# Patient Record
Sex: Female | Born: 1980 | Race: Black or African American | Hispanic: No | Marital: Married | Smoking: Never smoker
Health system: Southern US, Community
[De-identification: ages and names within clinical notes are randomized; demographics above are authoritative.]

## PROBLEM LIST (undated history)

## (undated) ENCOUNTER — Inpatient Hospital Stay (HOSPITAL_COMMUNITY): Payer: Self-pay

## (undated) DIAGNOSIS — B009 Herpesviral infection, unspecified: Secondary | ICD-10-CM

## (undated) HISTORY — PX: HERNIA REPAIR: SHX51

---
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
## 2012-09-15 NOTE — L&D Delivery Note (Signed)
Pt slowly progressed through the first stage. Just prior to pushing she had a prolonged decel which was txd with position changes, terb, and O2. The reactivity remained good throughout. She pushed for 40 minutes. She had a SVD of one live viable black female infant over a second degree midline tear in the ROA position. Placenta S/I . EBL-400cc. Baby to NBN. Tear closed with 3-0 chromic.

## 2012-12-15 LAB — OB RESULTS CONSOLE GC/CHLAMYDIA
Chlamydia: NEGATIVE
Gonorrhea: NEGATIVE

## 2012-12-15 LAB — OB RESULTS CONSOLE HEPATITIS B SURFACE ANTIGEN: Hepatitis B Surface Ag: NEGATIVE

## 2012-12-15 LAB — OB RESULTS CONSOLE ANTIBODY SCREEN: Antibody Screen: NEGATIVE

## 2012-12-20 LAB — OB RESULTS CONSOLE GBS: GBS: NEGATIVE

## 2013-01-12 ENCOUNTER — Encounter (HOSPITAL_COMMUNITY): Payer: Self-pay

## 2013-01-12 ENCOUNTER — Inpatient Hospital Stay (HOSPITAL_COMMUNITY)
Admission: AD | Admit: 2013-01-12 | Discharge: 2013-01-14 | DRG: 373 | Disposition: A | Discharge status: Home / Self Care | Payer: BC Managed Care – PPO | Source: Ambulatory Visit | Attending: Obstetrics and Gynecology | Admitting: Obstetrics and Gynecology

## 2013-01-12 LAB — AMNISURE RUPTURE OF MEMBRANE (ROM) NOT AT ARMC: Amnisure ROM: NEGATIVE

## 2013-01-12 LAB — CBC
HCT: 37.5 % (ref 36.0–46.0)
Hemoglobin: 12.6 g/dL (ref 12.0–15.0)
RBC: 3.97 MIL/uL (ref 3.87–5.11)
WBC: 13.7 10*3/uL — ABNORMAL HIGH (ref 4.0–10.5)

## 2013-01-12 MED ORDER — LIDOCAINE HCL (PF) 1 % IJ SOLN
30.0000 mL | INTRAMUSCULAR | Status: DC | PRN
  Filled 2013-01-12: qty 30

## 2013-01-12 MED ORDER — OXYTOCIN BOLUS FROM INFUSION
500.0000 mL | INTRAVENOUS | Status: DC
  Administered 2013-01-13: 500 mL via INTRAVENOUS

## 2013-01-12 MED ORDER — LACTATED RINGERS IV SOLN
INTRAVENOUS | Status: DC
  Administered 2013-01-12 – 2013-01-13 (×3): 125 mL/h via INTRAVENOUS

## 2013-01-12 MED ORDER — ONDANSETRON HCL 4 MG/2ML IJ SOLN
4.0000 mg | Freq: Four times a day (QID) | INTRAMUSCULAR | Status: DC | PRN
  Administered 2013-01-13: 4 mg via INTRAVENOUS
  Filled 2013-01-12: qty 2

## 2013-01-12 MED ORDER — OXYCODONE-ACETAMINOPHEN 5-325 MG PO TABS: 1.0000 | ORAL_TABLET | ORAL | Status: DC | PRN

## 2013-01-12 MED ORDER — ACETAMINOPHEN 325 MG PO TABS: 650.0000 mg | ORAL_TABLET | ORAL | Status: DC | PRN

## 2013-01-12 MED ORDER — TERBUTALINE SULFATE 1 MG/ML IJ SOLN: 0.2500 mg | Freq: Once | INTRAMUSCULAR | Status: AC | PRN

## 2013-01-12 MED ORDER — CITRIC ACID-SODIUM CITRATE 334-500 MG/5ML PO SOLN: 30.0000 mL | ORAL | Status: DC | PRN

## 2013-01-12 MED ORDER — BUTORPHANOL TARTRATE 1 MG/ML IJ SOLN
1.0000 mg | INTRAMUSCULAR | Status: DC | PRN
  Administered 2013-01-13 (×2): 1 mg via INTRAVENOUS
  Filled 2013-01-12 (×2): qty 1

## 2013-01-12 MED ORDER — LACTATED RINGERS IV SOLN
500.0000 mL | INTRAVENOUS | Status: DC | PRN
  Administered 2013-01-13: 500 mL via INTRAVENOUS
  Administered 2013-01-13: 1000 mL via INTRAVENOUS

## 2013-01-12 MED ORDER — OXYTOCIN 40 UNITS IN LACTATED RINGERS INFUSION - SIMPLE MED
62.5000 mL/h | INTRAVENOUS | Status: DC
  Administered 2013-01-13: 62.5 mL/h via INTRAVENOUS
  Filled 2013-01-12: qty 1000

## 2013-01-12 MED ORDER — OXYTOCIN 40 UNITS IN LACTATED RINGERS INFUSION - SIMPLE MED
1.0000 m[IU]/min | INTRAVENOUS | Status: DC
  Administered 2013-01-13: 2 m[IU]/min via INTRAVENOUS
  Administered 2013-01-13: 8 m[IU]/min via INTRAVENOUS
  Administered 2013-01-13: 2 m[IU]/min via INTRAVENOUS

## 2013-01-12 MED ORDER — IBUPROFEN 600 MG PO TABS: 600.0000 mg | ORAL_TABLET | Freq: Four times a day (QID) | ORAL | Status: DC | PRN

## 2013-01-12 MED ORDER — FLEET ENEMA 7-19 GM/118ML RE ENEM: 1.0000 | ENEMA | RECTAL | Status: DC | PRN

## 2013-01-12 NOTE — MAU Note (Signed)
Patient states she started leaking clear/yellowish fluid at 1615. Reports good fetal movement. Having some low back pain. Patient is scheduled for IOL tomorrow.

## 2013-01-12 NOTE — MAU Note (Signed)
Report given to [place], CN in BS. Patient will be evaluated in room 163.

## 2013-01-12 NOTE — Progress Notes (Signed)
MD on the phone and notified RN visualized leaking and fern positive. MD states to have midlevel preform speculum exam to confirm rupture.

## 2013-01-12 NOTE — MAU Provider Note (Signed)
Ms. Kelly Parrish is a 32 y.o. G2P1 at [redacted]w[redacted]d who presents to [facility] with possible ROM.   RN called from birthing suites. Dr. Dareen Piano would like speculum exam for confirmation of pooling for ROM.   + Pooling on speculum exam RN reports + Fer[place]  Report given to RN to contact Dr. Dareen Piano for further orders.   Freddi Starr, PA-C 01/12/2013 8:25 PM

## 2013-01-13 ENCOUNTER — Encounter (HOSPITAL_COMMUNITY): Payer: Self-pay | Admitting: Anesthesiology

## 2013-01-13 ENCOUNTER — Inpatient Hospital Stay (HOSPITAL_COMMUNITY): Payer: BC Managed Care – PPO | Admitting: Anesthesiology

## 2013-01-13 MED ORDER — OXYCODONE-ACETAMINOPHEN 5-325 MG PO TABS: 1.0000 | ORAL_TABLET | ORAL | Status: DC | PRN

## 2013-01-13 MED ORDER — TERBUTALINE SULFATE 1 MG/ML IJ SOLN
INTRAMUSCULAR | Status: AC
  Administered 2013-01-13: 0.25 mg via SUBCUTANEOUS
  Filled 2013-01-13: qty 1

## 2013-01-13 MED ORDER — LACTATED RINGERS IV SOLN: 500.0000 mL | Freq: Once | INTRAVENOUS | Status: DC

## 2013-01-13 MED ORDER — LIDOCAINE HCL (PF) 1 % IJ SOLN
INTRAMUSCULAR | Status: DC | PRN
Start: 2013-01-13 — End: 2013-01-13
  Administered 2013-01-13 (×2): 5 mL

## 2013-01-13 MED ORDER — EPHEDRINE 5 MG/ML INJ
10.0000 mg | INTRAVENOUS | Status: DC | PRN
  Filled 2013-01-13: qty 4
  Filled 2013-01-13: qty 2

## 2013-01-13 MED ORDER — ONDANSETRON HCL 4 MG PO TABS: 4.0000 mg | ORAL_TABLET | ORAL | Status: DC | PRN

## 2013-01-13 MED ORDER — EPHEDRINE 5 MG/ML INJ
10.0000 mg | INTRAVENOUS | Status: DC | PRN
  Filled 2013-01-13: qty 2

## 2013-01-13 MED ORDER — PRENATAL MULTIVITAMIN CH
1.0000 | ORAL_TABLET | Freq: Every day | ORAL | Status: DC
  Administered 2013-01-14: 1 via ORAL
  Filled 2013-01-13: qty 1

## 2013-01-13 MED ORDER — MEASLES, MUMPS & RUBELLA VAC ~~LOC~~ INJ
0.5000 mL | INJECTION | Freq: Once | SUBCUTANEOUS | Status: DC
  Filled 2013-01-13: qty 0.5

## 2013-01-13 MED ORDER — PHENYLEPHRINE 40 MCG/ML (10ML) SYRINGE FOR IV PUSH (FOR BLOOD PRESSURE SUPPORT)
80.0000 ug | PREFILLED_SYRINGE | INTRAVENOUS | Status: DC | PRN
  Filled 2013-01-13: qty 5
  Filled 2013-01-13: qty 2

## 2013-01-13 MED ORDER — BENZOCAINE-MENTHOL 20-0.5 % EX AERO
1.0000 "application " | INHALATION_SPRAY | CUTANEOUS | Status: DC | PRN
  Administered 2013-01-13: 1 via TOPICAL
  Filled 2013-01-13: qty 56

## 2013-01-13 MED ORDER — TETANUS-DIPHTH-ACELL PERTUSSIS 5-2.5-18.5 LF-MCG/0.5 IM SUSP
0.5000 mL | Freq: Once | INTRAMUSCULAR | Status: AC
  Administered 2013-01-14: 0.5 mL via INTRAMUSCULAR
  Filled 2013-01-13: qty 0.5

## 2013-01-13 MED ORDER — DIPHENHYDRAMINE HCL 50 MG/ML IJ SOLN: 12.5000 mg | INTRAMUSCULAR | Status: DC | PRN

## 2013-01-13 MED ORDER — FENTANYL 2.5 MCG/ML BUPIVACAINE 1/10 % EPIDURAL INFUSION (WH - ANES)
14.0000 mL/h | INTRAMUSCULAR | Status: DC | PRN
  Administered 2013-01-13: 14 mL/h via EPIDURAL
  Filled 2013-01-13 (×3): qty 125

## 2013-01-13 MED ORDER — FENTANYL 2.5 MCG/ML BUPIVACAINE 1/10 % EPIDURAL INFUSION (WH - ANES)
INTRAMUSCULAR | Status: DC | PRN
  Administered 2013-01-13: 16 mL/h via EPIDURAL

## 2013-01-13 MED ORDER — PHENYLEPHRINE 40 MCG/ML (10ML) SYRINGE FOR IV PUSH (FOR BLOOD PRESSURE SUPPORT)
80.0000 ug | PREFILLED_SYRINGE | INTRAVENOUS | Status: DC | PRN
  Filled 2013-01-13: qty 2

## 2013-01-13 MED ORDER — SIMETHICONE 80 MG PO CHEW: 80.0000 mg | CHEWABLE_TABLET | ORAL | Status: DC | PRN

## 2013-01-13 MED ORDER — ONDANSETRON HCL 4 MG/2ML IJ SOLN: 4.0000 mg | INTRAMUSCULAR | Status: DC | PRN

## 2013-01-13 MED ORDER — DIBUCAINE 1 % RE OINT
1.0000 "application " | TOPICAL_OINTMENT | RECTAL | Status: DC | PRN
  Administered 2013-01-13: 1 via RECTAL
  Filled 2013-01-13: qty 28

## 2013-01-13 MED ORDER — WITCH HAZEL-GLYCERIN EX PADS
1.0000 "application " | MEDICATED_PAD | CUTANEOUS | Status: DC | PRN
  Administered 2013-01-13: 1 via TOPICAL

## 2013-01-13 MED ORDER — IBUPROFEN 600 MG PO TABS
600.0000 mg | ORAL_TABLET | Freq: Four times a day (QID) | ORAL | Status: DC
  Administered 2013-01-13 – 2013-01-14 (×5): 600 mg via ORAL
  Filled 2013-01-13 (×5): qty 1

## 2013-01-13 MED ORDER — ZOLPIDEM TARTRATE 5 MG PO TABS: 5.0000 mg | ORAL_TABLET | Freq: Every evening | ORAL | Status: DC | PRN

## 2013-01-13 NOTE — Anesthesia Procedure Notes (Signed)
Epidural Patient location during procedure: OB Start time: 01/13/2013 3:50 AM  Staffing Anesthesiologist: Jadavion Spoelstra A. Performed by: anesthesiologist   Preanesthetic Checklist Completed: patient identified, site marked, surgical consent, pre-op evaluation, timeout performed, IV checked, risks and benefits discussed and monitors and equipment checked  Epidural Patient position: sitting Prep: site prepped and draped and DuraPrep Patient monitoring: continuous pulse ox and blood pressure Approach: midline Injection technique: LOR air  Needle:  Needle type: Tuohy  Needle gauge: 17 G Needle length: 9 cm and 9 Needle insertion depth: 5 cm cm Catheter type: closed end flexible Catheter size: 19 Gauge Catheter at skin depth: 10 cm Test dose: negative and Other  Assessment Events: blood not aspirated, injection not painful, no injection resistance, negative IV test and no paresthesia  Additional Notes Patient identified. Risks and benefits discussed including failed block, incomplete  Pain control, post dural puncture headache, nerve damage, paralysis, blood pressure Changes, nausea, vomiting, reactions to medications-both toxic and allergic and post Partum back pain. All questions were answered. Patient expressed understanding and wished to proceed. Sterile technique was used throughout procedure. Epidural site was Dressed with sterile barrier dressing. No paresthesias, signs of intravascular injection Or signs of intrathecal spread were encountered.  Patient was more comfortable after the epidural was dosed. Please see RN's note for documentation of vital signs and FHR which are stable.

## 2013-01-13 NOTE — Progress Notes (Signed)
Dr Dareen Piano notified of fhr changes, interventions and Pitocin stopped.  No new orders

## 2013-01-13 NOTE — Progress Notes (Signed)
Dr Dareen Piano notified of prolonged decel.  Pitocin off, Terb given, pt turned. IV bolus and oxygen

## 2013-01-13 NOTE — Anesthesia Preprocedure Evaluation (Signed)
Anesthesia Evaluation  Patient identified by MRN, date of birth, ID band Patient awake    Reviewed: Allergy & Precautions, H&P , Patient's Chart, lab work & pertinent test results  Airway Mallampati: III TM Distance: >3 FB Neck ROM: Full    Dental no notable dental hx. (+) Teeth Intact   Pulmonary neg pulmonary ROS,  breath sounds clear to auscultation  Pulmonary exam normal       Cardiovascular negative cardio ROS  Rhythm:Regular Rate:Normal     Neuro/Psych negative neurological ROS  negative psych ROS   GI/Hepatic Neg liver ROS, GERD-  Medicated and Controlled,  Endo/Other  Obesity   Renal/GU negative Renal ROS  negative genitourinary   Musculoskeletal negative musculoskeletal ROS (+)   Abdominal (+) + obese,   Peds  Hematology negative hematology ROS (+)   Anesthesia Other Findings   Reproductive/Obstetrics (+) Pregnancy                           Anesthesia Physical Anesthesia Plan  ASA: II  Anesthesia Plan: Epidural   Post-op Pain Management:    Induction:   Airway Management Planned: Natural Airway  Additional Equipment:   Intra-op Plan:   Post-operative Plan:   Informed Consent: I have reviewed the patients History and Physical, chart, labs and discussed the procedure including the risks, benefits and alternatives for the proposed anesthesia with the patient or authorized representative who has indicated his/her understanding and acceptance.     Plan Discussed with: Anesthesiologist  Anesthesia Plan Comments:         Anesthesia Quick Evaluation   

## 2013-01-14 LAB — CBC
MCH: 31.6 pg (ref 26.0–34.0)
MCV: 95.1 fL (ref 78.0–100.0)
Platelets: 145 10*3/uL — ABNORMAL LOW (ref 150–400)
RBC: 3.04 MIL/uL — ABNORMAL LOW (ref 3.87–5.11)
RDW: 14.7 % (ref 11.5–15.5)
WBC: 20.4 10*3/uL — ABNORMAL HIGH (ref 4.0–10.5)

## 2013-01-14 MED ORDER — IBUPROFEN 600 MG PO TABS: 600.0000 mg | ORAL_TABLET | Freq: Four times a day (QID) | ORAL | Status: DC | PRN

## 2013-01-14 MED ORDER — DOCUSATE SODIUM 100 MG PO CAPS: 100.0000 mg | ORAL_CAPSULE | Freq: Two times a day (BID) | ORAL | Status: DC

## 2013-01-14 MED ORDER — OXYCODONE-ACETAMINOPHEN 5-325 MG PO TABS: 2.0000 | ORAL_TABLET | ORAL | Status: DC | PRN

## 2013-01-14 NOTE — Anesthesia Postprocedure Evaluation (Signed)
  Anesthesia Post-op Note  Patient: Kelly Parrish  Procedure(s) Performed: * No procedures listed *  Patient Location: PACU and Mother/Baby  Anesthesia Type:Epidural  Level of Consciousness: awake, alert  and oriented  Airway and Oxygen Therapy: Patient Spontanous Breathing  Post-op Pain: none  Post-op Assessment: Post-op Vital signs reviewed, Patient's Cardiovascular Status Stable, No headache, No backache, No residual numbness and No residual motor weakness  Post-op Vital Signs: Reviewed and stable  Complications: No apparent anesthesia complications

## 2013-01-14 NOTE — Discharge Summary (Signed)
Obstetric Discharge Summary Reason for Admission: onset of labor Prenatal Procedures: NST and ultrasound Intrapartum Procedures: spontaneous vaginal delivery Postpartum Procedures: none Complications-Operative and Postpartum: 2nd degree perineal laceration Hemoglobin  Date Value Range Status  01/14/2013 9.6* 12.0 - 15.0 g/dL Final     DELTA CHECK NOTED     REPEATED TO VERIFY     HCT  Date Value Range Status  01/14/2013 28.9* 36.0 - 46.0 % Final    Physical Exam:  General: alert, cooperative and appears stated age 32: appropriate Uterine Fundus: firm Discharge Diagnoses: Term Pregnancy-delivered  Discharge Information: Date: 01/14/2013 Activity: pelvic rest Diet: routine Medications: Ibuprofen, Colace and Percocet Condition: improved Instructions: refer to practice specific booklet Discharge to: home Follow-up Information   Follow up with Levi Aland, MD In 4 weeks. (For a postpartum evaluation)    Contact information:   [address] [place] [address] [phone]       Newborn Data: Live born female  Birth Weight: 8 lb 15.6 oz (4071 g) APGAR: 8, 9  Home with mother.  Cyndal Kasson H. 01/14/2013, 9:57 AM

## 2013-01-14 NOTE — Progress Notes (Signed)
Post Partum Day 1 Subjective: no complaints, up ad lib, voiding and tolerating PO  Objective: Blood pressure 106/58, pulse 98, temperature 98.3 F (36.8 C), temperature source Axillary, resp. rate 18, height 5\' 10"  (1.778 m), weight 107.956 kg (238 lb), SpO2 100.00%, unknown if currently breastfeeding.  Physical Exam:  General: alert, cooperative and appears stated age Lochia: appropriate Uterine Fundus: firm   Recent Labs  01/12/13 2046 01/14/13 0605  HGB 12.6 9.6*  HCT 37.5 28.9*    Assessment/Plan: Plan for discharge tomorrow, Breastfeeding and Circumcision prior to discharge R/B/A of Circumcision reviewed with patient. Will perform circ once infant urinates   LOS: 2 days   Rual Vermeer H. 01/14/2013, 9:52 AM

## 2013-01-31 NOTE — H&P (Signed)
Pt is a 32 year old female, G2 P0010 at term who presents to L&D in labor. PNC was uncomplicated.  PMHX: see hollister PE: VSSAF         HEENT- wnl        ABD- gravid, palp contractions        FHTs- reactive IMP/ IUP at term in labor. PLAN/ Admit

## 2014-07-17 ENCOUNTER — Encounter (HOSPITAL_COMMUNITY): Payer: Self-pay | Admitting: Anesthesiology

## 2015-09-16 NOTE — L&D Delivery Note (Signed)
Patient was C/C/+3 and pushed for 1 minutes with epidural.    NSVD  female infant, Apgars 8,9, weight P.   The patient had Parrish R labial and first degree perineal lacerations repaired with 3-0 and 2-0 vicryl R. Fundus was firm. EBL was expected amount. Placenta was delivered intact. Vagina was clear.  Baby was vigorous and doing skin to skin with mother.  Kelly Parrish

## 2015-12-21 LAB — OB RESULTS CONSOLE GC/CHLAMYDIA
CHLAMYDIA, DNA PROBE: NEGATIVE
GC PROBE AMP, GENITAL: NEGATIVE

## 2015-12-21 LAB — OB RESULTS CONSOLE RPR: RPR: NONREACTIVE

## 2015-12-21 LAB — OB RESULTS CONSOLE ABO/RH: RH Type: POSITIVE

## 2015-12-21 LAB — OB RESULTS CONSOLE RUBELLA ANTIBODY, IGM: Rubella: IMMUNE

## 2015-12-21 LAB — OB RESULTS CONSOLE HEPATITIS B SURFACE ANTIGEN: HEP B S AG: NEGATIVE

## 2015-12-21 LAB — OB RESULTS CONSOLE HIV ANTIBODY (ROUTINE TESTING): HIV: NONREACTIVE

## 2015-12-21 LAB — OB RESULTS CONSOLE ANTIBODY SCREEN: ANTIBODY SCREEN: NEGATIVE

## 2016-01-01 ENCOUNTER — Inpatient Hospital Stay (HOSPITAL_COMMUNITY)
Admission: AD | Admit: 2016-01-01 | Discharge: 2016-01-01 | Disposition: A | Discharge status: Home / Self Care | Payer: BLUE CROSS/BLUE SHIELD | Source: Ambulatory Visit | Attending: Obstetrics and Gynecology | Admitting: Obstetrics and Gynecology

## 2016-01-01 ENCOUNTER — Encounter (HOSPITAL_COMMUNITY): Payer: Self-pay | Admitting: *Deleted

## 2016-01-01 DIAGNOSIS — O99611 Diseases of the digestive system complicating pregnancy, first trimester: Secondary | ICD-10-CM | POA: Diagnosis not present

## 2016-01-01 DIAGNOSIS — O219 Vomiting of pregnancy, unspecified: Secondary | ICD-10-CM | POA: Diagnosis not present

## 2016-01-01 DIAGNOSIS — K117 Disturbances of salivary secretion: Secondary | ICD-10-CM | POA: Diagnosis not present

## 2016-01-01 DIAGNOSIS — Z3A08 8 weeks gestation of pregnancy: Secondary | ICD-10-CM | POA: Insufficient documentation

## 2016-01-01 DIAGNOSIS — F5089 Other specified eating disorder: Secondary | ICD-10-CM | POA: Diagnosis not present

## 2016-01-01 DIAGNOSIS — O210 Mild hyperemesis gravidarum: Secondary | ICD-10-CM | POA: Insufficient documentation

## 2016-01-01 DIAGNOSIS — R112 Nausea with vomiting, unspecified: Secondary | ICD-10-CM | POA: Diagnosis present

## 2016-01-01 HISTORY — DX: Herpesviral infection, unspecified: B00.9

## 2016-01-01 LAB — URINALYSIS, ROUTINE W REFLEX MICROSCOPIC
Bilirubin Urine: NEGATIVE
GLUCOSE, UA: NEGATIVE mg/dL
KETONES UR: NEGATIVE mg/dL
Nitrite: NEGATIVE
PROTEIN: NEGATIVE mg/dL
Specific Gravity, Urine: 1.025 (ref 1.005–1.030)
pH: 6.5 (ref 5.0–8.0)

## 2016-01-01 LAB — URINE MICROSCOPIC-ADD ON: Bacteria, UA: NONE SEEN

## 2016-01-01 LAB — POCT PREGNANCY, URINE: PREG TEST UR: POSITIVE — AB

## 2016-01-01 MED ORDER — GLYCOPYRROLATE 1 MG PO TABS: 1.0000 mg | ORAL_TABLET | Freq: Three times a day (TID) | ORAL | Status: DC

## 2016-01-01 MED ORDER — SODIUM CHLORIDE 0.9 % IV SOLN: INTRAVENOUS | Status: DC

## 2016-01-01 MED ORDER — LACTATED RINGERS IV BOLUS (SEPSIS)
1000.0000 mL | Freq: Once | INTRAVENOUS | Status: AC
  Administered 2016-01-01: 1000 mL via INTRAVENOUS

## 2016-01-01 MED ORDER — PROMETHAZINE HCL 25 MG PO TABS: 12.5000 mg | ORAL_TABLET | Freq: Four times a day (QID) | ORAL | Status: DC | PRN

## 2016-01-01 MED ORDER — PROMETHAZINE HCL 25 MG/ML IJ SOLN
25.0000 mg | Freq: Once | INTRAMUSCULAR | Status: AC
  Administered 2016-01-01: 25 mg via INTRAVENOUS
  Filled 2016-01-01: qty 1

## 2016-01-01 NOTE — MAU Provider Note (Signed)
Chief Complaint: No chief complaint on file.   First Provider Initiated Contact with Patient 01/01/16 1820        SUBJECTIVE  HPI: Kelly Parrish is a 35 y.o. G3P2002 at [redacted]w[redacted]d by LMP who presents to maternity admissions reporting nausea and vomiting for 2 days.  States cannot keep any fluids down, vomits them up.  Can keep down potatoes and oatmeal.  Eating a lot of ice, so the family told her she was anemic, so she took "some iron pills".  Has not been told that by MDs.  Spitting a lot.  Wants med for that. She denies vaginal bleeding, vaginal itching/burning, urinary symptoms, h/a, dizziness, n/v, or fever/chills.    Emesis  This is a new problem. The current episode started in the past 7 days. The problem occurs less than 2 times per day. The problem has been unchanged. There has been no fever. Pertinent negatives include no abdominal pain, chills, diarrhea, dizziness, fever, headaches or myalgias. She has tried nothing for the symptoms.   RN Note: Has been weak for about a week. Started on iron pills yesterday.keeping. down a little bit of food, but not drinks. Hasn't really vomited the past 2 days. Has been seen at dr's office. Called them today and was instructed to come in         Past Medical History  Diagnosis Date  . Herpes    Past Surgical History  Procedure Laterality Date  . Wisdom tooth extraction  1996  . Hernia repair  1981   Social History   Social History  . Marital Status: Single    Spouse Name: N/A  . Number of Children: N/A  . Years of Education: N/A   Occupational History  . Not on file.   Social History Main Topics  . Smoking status: Never Smoker   . Smokeless tobacco: Never Used  . Alcohol Use: No  . Drug Use: No  . Sexual Activity: Yes    Birth Control/ Protection: None   Other Topics Concern  . Not on file   Social History Narrative   No current facility-administered medications on file prior to encounter.   Current Outpatient  Prescriptions on File Prior to Encounter  Medication Sig Dispense Refill  . docusate sodium (COLACE) 100 MG capsule Take 1 capsule (100 mg total) by mouth 2 (two) times daily. 60 capsule 0  . ferrous fumarate (HEMOCYTE - 106 MG FE) 325 (106 FE) MG TABS Take 1 tablet by mouth.    Marland Kitchen ibuprofen (ADVIL,MOTRIN) 600 MG tablet Take 1 tablet (600 mg total) by mouth every 6 (six) hours as needed for pain. 90 tablet 0  . oxyCODONE-acetaminophen (ROXICET) 5-325 MG per tablet Take 2 tablets by mouth every 4 (four) hours as needed for pain. May take 1-2 tablets every 4-6 hours as needed for pain 30 tablet 0  . Prenatal Vit-Fe Fumarate-FA (PRENATAL MULTIVITAMIN) TABS Take 1 tablet by mouth daily at 12 noon.     No Known Allergies  I have reviewed patient's Past Medical Hx, Surgical Hx, Family Hx, Social Hx, medications and allergies.   ROS:  Review of Systems  Constitutional: Negative for fever and chills.  Respiratory: Negative for shortness of breath.   Gastrointestinal: Positive for nausea and vomiting. Negative for abdominal pain, diarrhea and constipation.  Genitourinary: Negative for dysuria, vaginal bleeding, vaginal discharge and pelvic pain.  Musculoskeletal: Negative for myalgias.  Neurological: Negative for dizziness and headaches.    Physical Exam  Patient Vitals  for the past 24 hrs:  BP Temp Temp src Pulse Resp SpO2 Height Weight  01/01/16 1830 110/70 mmHg - - 77 - - - -  01/01/16 1828 113/70 mmHg - - 64 - - - -  01/01/16 1827 117/68 mmHg - - 63 - - - -  01/01/16 1613 - - - - - 100 % - -  01/01/16 1559 118/59 mmHg 98.5 F (36.9 C) Oral 68 18 - 5\' 10"  (1.778 m) 83.178 kg (183 lb 6 oz)    Physical Exam  Constitutional: Well-developed, female in no acute distress. Spitting frequently Cardiovascular: normal rate and rhythm.  No tachycardia Respiratory: normal effort, lungs clear bilaterally GI: Abd soft, non-tender. Pos BS x 4 MS: Extremities nontender, no edema, normal  ROM Neurologic: Alert and oriented x 4.  GU: Neg CVAT.   LAB RESULTS Results for orders placed or performed during the hospital encounter of 01/01/16 (from the past 24 hour(s))  Urinalysis, Routine w reflex microscopic (not at [facility])     Status: Abnormal   Collection Time: 01/01/16  4:19 PM  Result Value Ref Range   Color, Urine YELLOW YELLOW   APPearance CLEAR CLEAR   Specific Gravity, Urine 1.025 1.005 - 1.030   pH 6.5 5.0 - 8.0   Glucose, UA NEGATIVE NEGATIVE mg/dL   Hgb urine dipstick TRACE (A) NEGATIVE   Bilirubin Urine NEGATIVE NEGATIVE   Ketones, ur NEGATIVE NEGATIVE mg/dL   Protein, ur NEGATIVE NEGATIVE mg/dL   Nitrite NEGATIVE NEGATIVE   Leukocytes, UA TRACE (A) NEGATIVE  Urine microscopic-add on     Status: Abnormal   Collection Time: 01/01/16  4:19 PM  Result Value Ref Range   Squamous Epithelial / LPF 0-5 (A) NONE SEEN   WBC, UA 0-5 0 - 5 WBC/hpf   RBC / HPF 0-5 0 - 5 RBC/hpf   Bacteria, UA NONE SEEN NONE SEEN   Urine-Other MUCOUS PRESENT   Pregnancy, urine POC     Status: Abnormal   Collection Time: 01/01/16  4:26 PM  Result Value Ref Range   Preg Test, Ur POSITIVE (A) NEGATIVE       IMAGING No results found.  MAU Management/MDM: Consult Dr Henderson CloudHorvath with presentation, exam findings, and results.    Will hydrate with Phenergan infusion. When hydrated and tolerating POs, will discharge home  ASSESSMENT SIUP at 7415w5d  Nausea and vomiting of pregnancy Ptyalism Pica  PLAN Discharge home Advance diet as tolerated, push fluids Rx Phenergan for PRN use Rx small amt of Robinul for ptyalism Follow up in office for evaluation of response    Medication List    ASK your doctor about these medications        docusate sodium 100 MG capsule  Commonly known as:  COLACE  Take 1 capsule (100 mg total) by mouth 2 (two) times daily.     ferrous fumarate 325 (106 Fe) MG Tabs tablet  Commonly known as:  HEMOCYTE - 106 mg FE  Take 1 tablet by mouth.      ibuprofen 600 MG tablet  Commonly known as:  ADVIL,MOTRIN  Take 1 tablet (600 mg total) by mouth every 6 (six) hours as needed for pain.     oxyCODONE-acetaminophen 5-325 MG tablet  Commonly known as:  ROXICET  Take 2 tablets by mouth every 4 (four) hours as needed for pain. May take 1-2 tablets every 4-6 hours as needed for pain     prenatal multivitamin Tabs tablet  Take 1 tablet by mouth  daily at 12 noon.        Pt stable at time of discharge. Encouraged to return here or to other Urgent Care/ED if she develops worsening of symptoms, increase in pain, fever, or other concerning symptoms.    Wynelle Bourgeois CNM, MSN Certified Nurse-Midwife 01/01/2016  6:43 PM

## 2016-01-01 NOTE — MAU Note (Addendum)
Has been weak for about a week.  Started on iron pills yesterday.keeping. down a little bit of food, but not drinks.  Hasn't really vomited the past 2 days. Has been seen at dr's office. Called them today and was instructed to come in

## 2016-01-01 NOTE — Discharge Instructions (Signed)

## 2016-05-28 ENCOUNTER — Inpatient Hospital Stay (HOSPITAL_COMMUNITY)
Admission: AD | Admit: 2016-05-28 | Discharge: 2016-05-28 | Disposition: A | Discharge status: Home / Self Care | Payer: BLUE CROSS/BLUE SHIELD | Source: Ambulatory Visit | Attending: Obstetrics & Gynecology | Admitting: Obstetrics & Gynecology

## 2016-05-28 ENCOUNTER — Encounter (HOSPITAL_COMMUNITY): Payer: Self-pay | Admitting: *Deleted

## 2016-05-28 ENCOUNTER — Inpatient Hospital Stay (HOSPITAL_COMMUNITY): Payer: BLUE CROSS/BLUE SHIELD

## 2016-05-28 DIAGNOSIS — O26899 Other specified pregnancy related conditions, unspecified trimester: Secondary | ICD-10-CM

## 2016-05-28 DIAGNOSIS — R109 Unspecified abdominal pain: Secondary | ICD-10-CM

## 2016-05-28 DIAGNOSIS — E860 Dehydration: Secondary | ICD-10-CM

## 2016-05-28 DIAGNOSIS — Z3689 Encounter for other specified antenatal screening: Secondary | ICD-10-CM

## 2016-05-28 DIAGNOSIS — O4703 False labor before 37 completed weeks of gestation, third trimester: Secondary | ICD-10-CM

## 2016-05-28 DIAGNOSIS — Z3493 Encounter for supervision of normal pregnancy, unspecified, third trimester: Secondary | ICD-10-CM

## 2016-05-28 DIAGNOSIS — Z3A29 29 weeks gestation of pregnancy: Secondary | ICD-10-CM | POA: Insufficient documentation

## 2016-05-28 LAB — URINALYSIS, ROUTINE W REFLEX MICROSCOPIC
Bilirubin Urine: NEGATIVE
Glucose, UA: NEGATIVE mg/dL
Hgb urine dipstick: NEGATIVE
Ketones, ur: NEGATIVE mg/dL
NITRITE: NEGATIVE
PROTEIN: NEGATIVE mg/dL
SPECIFIC GRAVITY, URINE: 1.025 (ref 1.005–1.030)
pH: 6 (ref 5.0–8.0)

## 2016-05-28 LAB — WET PREP, GENITAL
CLUE CELLS WET PREP: NONE SEEN
Sperm: NONE SEEN
Trich, Wet Prep: NONE SEEN
Yeast Wet Prep HPF POC: NONE SEEN

## 2016-05-28 LAB — CBC
HEMATOCRIT: 31.4 % — AB (ref 36.0–46.0)
HEMOGLOBIN: 10.7 g/dL — AB (ref 12.0–15.0)
MCH: 31.4 pg (ref 26.0–34.0)
MCHC: 34.1 g/dL (ref 30.0–36.0)
MCV: 92.1 fL (ref 78.0–100.0)
PLATELETS: 183 10*3/uL (ref 150–400)
RBC: 3.41 MIL/uL — AB (ref 3.87–5.11)
RDW: 13.3 % (ref 11.5–15.5)
WBC: 14.1 10*3/uL — AB (ref 4.0–10.5)

## 2016-05-28 LAB — URINE MICROSCOPIC-ADD ON

## 2016-05-28 LAB — FETAL FIBRONECTIN: Fetal Fibronectin: NEGATIVE

## 2016-05-28 MED ORDER — OXYCODONE-ACETAMINOPHEN 7.5-325 MG PO TABS
1.0000 | ORAL_TABLET | Freq: Once | ORAL | Status: AC
  Administered 2016-05-28: 1 via ORAL
  Filled 2016-05-28: qty 1

## 2016-05-28 MED ORDER — NIFEDIPINE 10 MG PO CAPS
10.0000 mg | ORAL_CAPSULE | Freq: Once | ORAL | Status: AC
  Administered 2016-05-28: 10 mg via ORAL
  Filled 2016-05-28: qty 1

## 2016-05-28 NOTE — MAU Provider Note (Signed)
History     CSN: [id number]  Arrival date and time: 05/28/16 [id number]   First Provider Initiated Contact with Patient 05/28/16 0901      Chief Complaint  Patient presents with  . Abdominal Pain   G3P2002 @29 .6 weeks c/o intermittent abdominal pain. She describes as sharp, located in mid abdomen, and started 2 hrs ago. She thinks it may be ctx. She reports good FM. No VB and LOF. She was recently treated for BV and denies vaginal discharge or odor. She admits to poor hydration this am, has only had 1 cup of water. Pregnancy has been complicated by low-lying placenta.   OB History    Gravida Para Term Preterm AB Living   3 2 2     2    SAB TAB Ectopic Multiple Live Births           1      Past Medical History:  Diagnosis Date  . Herpes     Past Surgical History:  Procedure Laterality Date  . HERNIA REPAIR  1981  . WISDOM TOOTH EXTRACTION  1996    History reviewed. No pertinent family history.  Social History  Substance Use Topics  . Smoking status: Never Smoker  . Smokeless tobacco: Never Used  . Alcohol use No    Allergies: No Known Allergies  Prescriptions Prior to Admission  Medication Sig Dispense Refill Last Dose  . ferrous fumarate (HEMOCYTE - 106 MG FE) 325 (106 FE) MG TABS Take 1 tablet by mouth.   01/01/2016 at Unknown time  . glycopyrrolate (ROBINUL) 1 MG tablet Take 1 tablet (1 mg total) by mouth 3 (three) times daily. 15 tablet 0   . Prenatal Vit-Fe Fumarate-FA (PRENATAL MULTIVITAMIN) TABS Take 1 tablet by mouth daily at 12 noon.   01/01/2016 at Unknown time  . promethazine (PHENERGAN) 25 MG tablet Take 0.5-1 tablets (12.5-25 mg total) by mouth every 6 (six) hours as needed. 30 tablet 0     Review of Systems  Constitutional: Negative.   Gastrointestinal: Positive for abdominal pain. Negative for constipation, nausea and vomiting.  Genitourinary: Negative.    Physical Exam   Blood pressure 124/72, pulse 92, temperature 97.9 F (36.6 C), temperature  source Oral, resp. rate 20, last menstrual period 11/01/2015, SpO2 100 %, unknown if currently breastfeeding.  Physical Exam  Constitutional: She is oriented to person, place, and time. She appears well-developed and well-nourished.  HENT:  Head: Normocephalic and atraumatic.  Neck: Normal range of motion. Neck supple.  Cardiovascular: Normal rate.   Respiratory: Effort normal.  GI: Soft. She exhibits no distension and no mass. There is tenderness (mild at fundus). There is no rebound and no guarding.  Genitourinary:  Genitourinary Comments: External: no lesions Vagina: rugated, parous, thin white discharge SVE: closed/thick   Musculoskeletal: Normal range of motion.  Neurological: She is alert and oriented to person, place, and time.  Skin: Skin is warm and dry.  Psychiatric: She has a normal mood and affect.  EFM: 145 bpm, mod variability, + accels, no decels Toco: irregular  Results for orders placed or performed during the hospital encounter of 05/28/16 (from the past 24 hour(s))  Urinalysis, Routine w reflex microscopic (not at [facility])     Status: Abnormal   Collection Time: 05/28/16  8:23 AM  Result Value Ref Range   Color, Urine YELLOW YELLOW   APPearance CLEAR CLEAR   Specific Gravity, Urine 1.025 1.005 - 1.030   pH 6.0 5.0 - 8.0   Glucose,  UA NEGATIVE NEGATIVE mg/dL   Hgb urine dipstick NEGATIVE NEGATIVE   Bilirubin Urine NEGATIVE NEGATIVE   Ketones, ur NEGATIVE NEGATIVE mg/dL   Protein, ur NEGATIVE NEGATIVE mg/dL   Nitrite NEGATIVE NEGATIVE   Leukocytes, UA TRACE (A) NEGATIVE  Urine microscopic-add on     Status: Abnormal   Collection Time: 05/28/16  8:23 AM  Result Value Ref Range   Squamous Epithelial / LPF 6-30 (A) NONE SEEN   WBC, UA 6-30 0 - 5 WBC/hpf   RBC / HPF 0-5 0 - 5 RBC/hpf   Bacteria, UA MANY (A) NONE SEEN   Urine-Other MUCOUS PRESENT   Wet prep, genital     Status: Abnormal   Collection Time: 05/28/16  9:10 AM  Result Value Ref Range   Yeast Wet  Prep HPF POC NONE SEEN NONE SEEN   Trich, Wet Prep NONE SEEN NONE SEEN   Clue Cells Wet Prep HPF POC NONE SEEN NONE SEEN   WBC, Wet Prep HPF POC MODERATE (A) NONE SEEN   Sperm NONE SEEN   Fetal fibronectin     Status: None   Collection Time: 05/28/16  9:10 AM  Result Value Ref Range   Fetal Fibronectin NEGATIVE NEGATIVE  CBC     Status: Abnormal   Collection Time: 05/28/16  9:32 AM  Result Value Ref Range   WBC 14.1 (H) 4.0 - 10.5 K/uL   RBC 3.41 (L) 3.87 - 5.11 MIL/uL   Hemoglobin 10.7 (L) 12.0 - 15.0 g/dL   HCT 16.1 (L) 09.6 - 04.5 %   MCV 92.1 78.0 - 100.0 fL   MCH 31.4 26.0 - 34.0 pg   MCHC 34.1 30.0 - 36.0 g/dL   RDW 40.9 81.1 - 91.4 %   Platelets 183 150 - 400 K/uL    MAU Course  Procedures Po hydration Procardia 10mg  po x1 Percocet x1  MDM Labs and [place] ordered and reviewed. Pt feeling much better after meds, no ctx on toco. No evidence of PTL or abruption. Discussed presentation, clinical exam, and labs/US findings with Dr. Mora Appl. Stable for discharge home.   Assessment and Plan   1. Third trimester pregnancy   2. Abdominal pain affecting pregnancy   3. Preterm contractions, third trimester   4. Dehydration   5. NST (non-stress test) reactive    Discharge home Increase po hydration PTL precautions Follow up in office next week as scheduled  Donette Larry, CNM 05/28/2016, 9:20 AM

## 2016-05-28 NOTE — MAU Note (Signed)
Pt states interm. Stabbing upper mid abd pain started 1.5 hours ago.  No vaginal bleeding, ROM, or abnormal discharge.  Good fetal movement.

## 2016-05-29 LAB — GC/CHLAMYDIA PROBE AMP (~~LOC~~) NOT AT ARMC
Chlamydia: NEGATIVE
NEISSERIA GONORRHEA: NEGATIVE

## 2016-06-05 ENCOUNTER — Ambulatory Visit (HOSPITAL_COMMUNITY)
Admission: RE | Admit: 2016-06-05 | Discharge: 2016-06-05 | Disposition: A | Discharge status: Home / Self Care | Payer: BLUE CROSS/BLUE SHIELD | Source: Ambulatory Visit | Attending: Obstetrics and Gynecology | Admitting: Obstetrics and Gynecology

## 2016-06-05 DIAGNOSIS — R002 Palpitations: Secondary | ICD-10-CM | POA: Diagnosis present

## 2016-07-02 ENCOUNTER — Other Ambulatory Visit: Payer: Self-pay | Admitting: Obstetrics and Gynecology

## 2016-07-24 ENCOUNTER — Telehealth (HOSPITAL_COMMUNITY): Payer: Self-pay | Admitting: *Deleted

## 2016-07-24 ENCOUNTER — Encounter (HOSPITAL_COMMUNITY): Payer: Self-pay | Admitting: *Deleted

## 2016-07-24 NOTE — Telephone Encounter (Signed)
Preadmission screen  

## 2016-08-01 ENCOUNTER — Inpatient Hospital Stay (HOSPITAL_COMMUNITY): Payer: BLUE CROSS/BLUE SHIELD | Admitting: Anesthesiology

## 2016-08-01 ENCOUNTER — Encounter (HOSPITAL_COMMUNITY): Payer: Self-pay

## 2016-08-01 ENCOUNTER — Inpatient Hospital Stay (HOSPITAL_COMMUNITY)
Admission: RE | Admit: 2016-08-01 | Discharge: 2016-08-04 | DRG: 774 | Disposition: A | Discharge status: Home / Self Care | Payer: BLUE CROSS/BLUE SHIELD | Source: Ambulatory Visit | Attending: Obstetrics and Gynecology | Admitting: Obstetrics and Gynecology

## 2016-08-01 DIAGNOSIS — Z6834 Body mass index (BMI) 34.0-34.9, adult: Secondary | ICD-10-CM | POA: Diagnosis not present

## 2016-08-01 DIAGNOSIS — Z3A39 39 weeks gestation of pregnancy: Secondary | ICD-10-CM | POA: Diagnosis not present

## 2016-08-01 DIAGNOSIS — O9962 Diseases of the digestive system complicating childbirth: Secondary | ICD-10-CM | POA: Diagnosis present

## 2016-08-01 DIAGNOSIS — O9832 Other infections with a predominantly sexual mode of transmission complicating childbirth: Secondary | ICD-10-CM | POA: Diagnosis present

## 2016-08-01 DIAGNOSIS — O99214 Obesity complicating childbirth: Secondary | ICD-10-CM | POA: Diagnosis present

## 2016-08-01 DIAGNOSIS — A6000 Herpesviral infection of urogenital system, unspecified: Secondary | ICD-10-CM | POA: Diagnosis present

## 2016-08-01 DIAGNOSIS — O26893 Other specified pregnancy related conditions, third trimester: Secondary | ICD-10-CM | POA: Diagnosis present

## 2016-08-01 DIAGNOSIS — K219 Gastro-esophageal reflux disease without esophagitis: Secondary | ICD-10-CM | POA: Diagnosis present

## 2016-08-01 DIAGNOSIS — O99824 Streptococcus B carrier state complicating childbirth: Secondary | ICD-10-CM | POA: Diagnosis present

## 2016-08-01 DIAGNOSIS — E669 Obesity, unspecified: Secondary | ICD-10-CM | POA: Diagnosis present

## 2016-08-01 LAB — CBC
HEMATOCRIT: 30.6 % — AB (ref 36.0–46.0)
Hemoglobin: 10.6 g/dL — ABNORMAL LOW (ref 12.0–15.0)
MCH: 31.5 pg (ref 26.0–34.0)
MCHC: 34.6 g/dL (ref 30.0–36.0)
MCV: 90.8 fL (ref 78.0–100.0)
PLATELETS: 174 10*3/uL (ref 150–400)
RBC: 3.37 MIL/uL — AB (ref 3.87–5.11)
RDW: 14.6 % (ref 11.5–15.5)
WBC: 11.8 10*3/uL — AB (ref 4.0–10.5)

## 2016-08-01 LAB — ABO/RH: ABO/RH(D): B POS

## 2016-08-01 LAB — OB RESULTS CONSOLE GBS: STREP GROUP B AG: POSITIVE

## 2016-08-01 LAB — TYPE AND SCREEN
ABO/RH(D): B POS
ANTIBODY SCREEN: NEGATIVE

## 2016-08-01 MED ORDER — LACTATED RINGERS IV SOLN: 500.0000 mL | INTRAVENOUS | Status: DC | PRN

## 2016-08-01 MED ORDER — LIDOCAINE HCL (PF) 1 % IJ SOLN
30.0000 mL | INTRAMUSCULAR | Status: DC | PRN
  Filled 2016-08-01: qty 30

## 2016-08-01 MED ORDER — PENICILLIN G POTASSIUM 5000000 UNITS IJ SOLR
5.0000 10*6.[IU] | Freq: Once | INTRAVENOUS | Status: AC
  Administered 2016-08-01: 5 10*6.[IU] via INTRAVENOUS
  Filled 2016-08-01: qty 5

## 2016-08-01 MED ORDER — FENTANYL 2.5 MCG/ML BUPIVACAINE 1/10 % EPIDURAL INFUSION (WH - ANES)
14.0000 mL/h | INTRAMUSCULAR | Status: DC | PRN
  Administered 2016-08-01: 14 mL/h via EPIDURAL
  Filled 2016-08-01: qty 100

## 2016-08-01 MED ORDER — TERBUTALINE SULFATE 1 MG/ML IJ SOLN
0.2500 mg | Freq: Once | INTRAMUSCULAR | Status: DC | PRN
  Filled 2016-08-01: qty 1

## 2016-08-01 MED ORDER — ACETAMINOPHEN 325 MG PO TABS: 650.0000 mg | ORAL_TABLET | ORAL | Status: DC | PRN

## 2016-08-01 MED ORDER — OXYCODONE-ACETAMINOPHEN 5-325 MG PO TABS: 2.0000 | ORAL_TABLET | ORAL | Status: DC | PRN

## 2016-08-01 MED ORDER — PENICILLIN G POT IN DEXTROSE 60000 UNIT/ML IV SOLN
3.0000 10*6.[IU] | INTRAVENOUS | Status: DC
  Administered 2016-08-01 (×3): 3 10*6.[IU] via INTRAVENOUS
  Filled 2016-08-01 (×7): qty 50

## 2016-08-01 MED ORDER — PHENYLEPHRINE 40 MCG/ML (10ML) SYRINGE FOR IV PUSH (FOR BLOOD PRESSURE SUPPORT)
80.0000 ug | PREFILLED_SYRINGE | INTRAVENOUS | Status: DC | PRN
  Filled 2016-08-01: qty 5
  Filled 2016-08-01: qty 10

## 2016-08-01 MED ORDER — SOD CITRATE-CITRIC ACID 500-334 MG/5ML PO SOLN: 30.0000 mL | ORAL | Status: DC | PRN

## 2016-08-01 MED ORDER — PHENYLEPHRINE 40 MCG/ML (10ML) SYRINGE FOR IV PUSH (FOR BLOOD PRESSURE SUPPORT)
80.0000 ug | PREFILLED_SYRINGE | INTRAVENOUS | Status: DC | PRN
  Filled 2016-08-01: qty 5

## 2016-08-01 MED ORDER — EPHEDRINE 5 MG/ML INJ
10.0000 mg | INTRAVENOUS | Status: DC | PRN
  Filled 2016-08-01: qty 4

## 2016-08-01 MED ORDER — DIPHENHYDRAMINE HCL 50 MG/ML IJ SOLN: 12.5000 mg | INTRAMUSCULAR | Status: DC | PRN

## 2016-08-01 MED ORDER — OXYTOCIN 40 UNITS IN LACTATED RINGERS INFUSION - SIMPLE MED
1.0000 m[IU]/min | INTRAVENOUS | Status: DC
  Administered 2016-08-01: 2 m[IU]/min via INTRAVENOUS

## 2016-08-01 MED ORDER — ONDANSETRON HCL 4 MG/2ML IJ SOLN
4.0000 mg | Freq: Four times a day (QID) | INTRAMUSCULAR | Status: DC | PRN
  Administered 2016-08-01: 4 mg via INTRAVENOUS
  Filled 2016-08-01: qty 2

## 2016-08-01 MED ORDER — OXYTOCIN 40 UNITS IN LACTATED RINGERS INFUSION - SIMPLE MED
2.5000 [IU]/h | INTRAVENOUS | Status: DC
  Administered 2016-08-02: 2.5 [IU]/h via INTRAVENOUS
  Filled 2016-08-01: qty 1000

## 2016-08-01 MED ORDER — LACTATED RINGERS IV SOLN
INTRAVENOUS | Status: DC
  Administered 2016-08-01 (×2): via INTRAVENOUS

## 2016-08-01 MED ORDER — OXYTOCIN BOLUS FROM INFUSION
500.0000 mL | Freq: Once | INTRAVENOUS | Status: AC
  Administered 2016-08-02: 500 mL via INTRAVENOUS

## 2016-08-01 MED ORDER — LACTATED RINGERS IV SOLN
500.0000 mL | Freq: Once | INTRAVENOUS | Status: AC
  Administered 2016-08-01: 500 mL via INTRAVENOUS

## 2016-08-01 MED ORDER — OXYCODONE-ACETAMINOPHEN 5-325 MG PO TABS: 1.0000 | ORAL_TABLET | ORAL | Status: DC | PRN

## 2016-08-01 MED ORDER — BUTORPHANOL TARTRATE 1 MG/ML IJ SOLN: 1.0000 mg | INTRAMUSCULAR | Status: DC | PRN

## 2016-08-01 MED ORDER — LIDOCAINE HCL (PF) 1 % IJ SOLN
INTRAMUSCULAR | Status: DC | PRN
  Administered 2016-08-01 (×2): 4 mL via EPIDURAL

## 2016-08-01 NOTE — Anesthesia Pain Management Evaluation Note (Signed)
  CRNA Pain Management Visit Note  Patient: Davey A Shutter, 35 y.o., female  "Hello I am a member of the anesthesia team at [facility]. We have an anesthesia team available at all times to provide care throughout the hospital, including epidural management and anesthesia for C-section. I don't know your plan for the delivery whether it a natural birth, water birth, IV sedation, nitrous supplementation, doula or epidural, but we want to meet your pain goals."   1.Was your pain managed to your expectations on prior hospitalizations?   Yes   2.What is your expectation for pain management during this hospitalization?     Epidural  3.How can we help you reach that goal? epidural  Record the patient's initial score and the patient's pain goal.   Pain: 5  Pain Goal: 7 [facility] wants you to be able to say your pain was always managed very well.  Reannah Totten 08/01/2016

## 2016-08-01 NOTE — Anesthesia Procedure Notes (Signed)
Epidural Patient location during procedure: OB Start time: 08/01/2016 3:08 PM  Staffing Anesthesiologist: Mal AmabileFOSTER, Aliviya Schoeller Performed: anesthesiologist   Preanesthetic Checklist Completed: patient identified, site marked, surgical consent, pre-op evaluation, timeout performed, IV checked, risks and benefits discussed and monitors and equipment checked  Epidural Patient position: sitting Prep: site prepped and draped and DuraPrep Patient monitoring: continuous pulse ox and blood pressure Approach: midline Location: L3-L4 Injection technique: LOR air  Needle:  Needle type: Tuohy  Needle gauge: 17 G Needle length: 9 cm and 9 Needle insertion depth: 5 cm cm Catheter type: closed end flexible Catheter size: 19 Gauge Catheter at skin depth: 10 cm Test dose: negative and Other  Assessment Events: blood not aspirated, injection not painful, no injection resistance, negative IV test and paresthesia  Additional Notes Patient identified. Risks and benefits discussed including failed block, incomplete  Pain control, post dural puncture headache, nerve damage, paralysis, blood pressure Changes, nausea, vomiting, reactions to medications-both toxic and allergic and post Partum back pain. All questions were answered. Patient expressed understanding and wished to proceed. Sterile technique was used throughout procedure. Epidural site was Dressed with sterile barrier dressing. No paresthesias, signs of intravascular injection Or signs of intrathecal spread were encountered. Difficulty threading epidural catheter. Transient paresthesia right leg. Patient was more comfortable after the epidural was dosed. Please see RN's note for documentation of vital signs and FHR which are stable.

## 2016-08-01 NOTE — Anesthesia Preprocedure Evaluation (Signed)
Anesthesia Evaluation  Patient identified by MRN, date of birth, ID band Patient awake    Reviewed: Allergy & Precautions, H&P , Patient's Chart, lab work & pertinent test results  Airway Mallampati: III  TM Distance: >3 FB Neck ROM: Full    Dental no notable dental hx. (+) Teeth Intact   Pulmonary neg pulmonary ROS,    Pulmonary exam normal breath sounds clear to auscultation       Cardiovascular negative cardio ROS Normal cardiovascular exam Rhythm:Regular Rate:Normal     Neuro/Psych negative neurological ROS  negative psych ROS   GI/Hepatic Neg liver ROS, GERD  Medicated and Controlled,  Endo/Other  Obesity   Renal/GU negative Renal ROS  negative genitourinary   Musculoskeletal negative musculoskeletal ROS (+)   Abdominal (+) + obese,   Peds  Hematology negative hematology ROS (+)   Anesthesia Other Findings   Reproductive/Obstetrics (+) Pregnancy HSV                             Anesthesia Physical  Anesthesia Plan  ASA: II  Anesthesia Plan: Epidural   Post-op Pain Management:    Induction:   Airway Management Planned: Natural Airway  Additional Equipment:   Intra-op Plan:   Post-operative Plan:   Informed Consent: I have reviewed the patients History and Physical, chart, labs and discussed the procedure including the risks, benefits and alternatives for the proposed anesthesia with the patient or authorized representative who has indicated his/her understanding and acceptance.     Plan Discussed with: Anesthesiologist  Anesthesia Plan Comments:         Anesthesia Quick Evaluation

## 2016-08-01 NOTE — H&P (Signed)
35 y.o. 339w1d  G3P2002 comes in for induction at term for AMA.  Otherwise has good fetal movement and no bleeding.  Past Medical History:  Diagnosis Date  . Herpes     Past Surgical History:  Procedure Laterality Date  . HERNIA REPAIR  1981  . WISDOM TOOTH EXTRACTION  1996    OB History  Gravida Para Term Preterm AB Living  3 2 2     2   SAB TAB Ectopic Multiple Live Births          1    # Outcome Date GA Lbr Len/2nd Weight Sex Delivery Anes PTL Lv  3 Current           2 Term 01/13/13 3833w5d 21:33 / 00:53 4.071 kg (8 lb 15.6 oz) M Vag-Spont EPI  LIV     Birth Comments: WNL  1 Term               Social History   Social History  . Marital status: Married    Spouse name: N/A  . Number of children: N/A  . Years of education: N/A   Occupational History  . Not on file.   Social History Main Topics  . Smoking status: Never Smoker  . Smokeless tobacco: Never Used  . Alcohol use No  . Drug use: No  . Sexual activity: Yes    Birth control/ protection: None   Other Topics Concern  . Not on file   Social History Narrative  . No narrative on file   Patient has no known allergies.    Prenatal Transfer Tool  Maternal Diabetes: No Genetic Screening: Normal NIPT low risk female Maternal Ultrasounds/Referrals: Normal Fetal Ultrasounds or other Referrals:  None Maternal Substance Abuse:  No Significant Maternal Medications:  Valtrex Significant Maternal Lab Results: None  Other PNC: uncomplicated.    There were no vitals filed for this visit.   Lungs/Cor:  NAD Abdomen:  soft, gravid Ex:  no cords, erythema SVE:  1/50/-1, AROM clear FHTs:  130s, good STV, NST R Toco:  q occ SSE neg for lesions   A/P   Induction for AMA at term.  Pt has no s/s hsv.    GBS POS- PCN.   Raylen Ken A

## 2016-08-01 NOTE — Progress Notes (Signed)
Pt relatively comfortable.  FHTS 130s, gSTV, NST R Toco q5-10 SVE 1-2/30/-2, VTX, AROM light mec  Continue pitocin.

## 2016-08-02 ENCOUNTER — Encounter (HOSPITAL_COMMUNITY): Payer: Self-pay

## 2016-08-02 LAB — CBC
HEMATOCRIT: 28.1 % — AB (ref 36.0–46.0)
HEMOGLOBIN: 10 g/dL — AB (ref 12.0–15.0)
MCH: 32.3 pg (ref 26.0–34.0)
MCHC: 35.6 g/dL (ref 30.0–36.0)
MCV: 90.6 fL (ref 78.0–100.0)
Platelets: 146 10*3/uL — ABNORMAL LOW (ref 150–400)
RBC: 3.1 MIL/uL — AB (ref 3.87–5.11)
RDW: 14.5 % (ref 11.5–15.5)
WBC: 15.7 10*3/uL — ABNORMAL HIGH (ref 4.0–10.5)

## 2016-08-02 LAB — RPR: RPR Ser Ql: NONREACTIVE

## 2016-08-02 MED ORDER — OXYCODONE-ACETAMINOPHEN 5-325 MG PO TABS: 2.0000 | ORAL_TABLET | ORAL | Status: DC | PRN

## 2016-08-02 MED ORDER — METHYLERGONOVINE MALEATE 0.2 MG/ML IJ SOLN: 0.2000 mg | INTRAMUSCULAR | Status: DC | PRN

## 2016-08-02 MED ORDER — WITCH HAZEL-GLYCERIN EX PADS: 1.0000 "application " | MEDICATED_PAD | CUTANEOUS | Status: DC | PRN

## 2016-08-02 MED ORDER — FAMOTIDINE 20 MG PO TABS
10.0000 mg | ORAL_TABLET | Freq: Two times a day (BID) | ORAL | Status: DC
  Administered 2016-08-02 – 2016-08-03 (×2): 10 mg via ORAL
  Filled 2016-08-02 (×2): qty 1

## 2016-08-02 MED ORDER — DIBUCAINE 1 % RE OINT: 1.0000 "application " | TOPICAL_OINTMENT | RECTAL | Status: DC | PRN

## 2016-08-02 MED ORDER — IBUPROFEN 800 MG PO TABS
800.0000 mg | ORAL_TABLET | Freq: Three times a day (TID) | ORAL | Status: DC
  Administered 2016-08-02 – 2016-08-04 (×7): 800 mg via ORAL
  Filled 2016-08-02 (×7): qty 1

## 2016-08-02 MED ORDER — ZOLPIDEM TARTRATE 5 MG PO TABS: 5.0000 mg | ORAL_TABLET | Freq: Every evening | ORAL | Status: DC | PRN

## 2016-08-02 MED ORDER — METHYLERGONOVINE MALEATE 0.2 MG PO TABS: 0.2000 mg | ORAL_TABLET | ORAL | Status: DC | PRN

## 2016-08-02 MED ORDER — MAGNESIUM HYDROXIDE 400 MG/5ML PO SUSP: 30.0000 mL | ORAL | Status: DC | PRN

## 2016-08-02 MED ORDER — DIPHENHYDRAMINE HCL 25 MG PO CAPS: 25.0000 mg | ORAL_CAPSULE | Freq: Four times a day (QID) | ORAL | Status: DC | PRN

## 2016-08-02 MED ORDER — SENNOSIDES-DOCUSATE SODIUM 8.6-50 MG PO TABS
2.0000 | ORAL_TABLET | ORAL | Status: DC
  Administered 2016-08-02: 2 via ORAL
  Filled 2016-08-02: qty 2

## 2016-08-02 MED ORDER — TETANUS-DIPHTH-ACELL PERTUSSIS 5-2.5-18.5 LF-MCG/0.5 IM SUSP: 0.5000 mL | Freq: Once | INTRAMUSCULAR | Status: DC

## 2016-08-02 MED ORDER — ACETAMINOPHEN 325 MG PO TABS
650.0000 mg | ORAL_TABLET | ORAL | Status: DC | PRN
Start: 2016-08-02 — End: 2016-08-04

## 2016-08-02 MED ORDER — SIMETHICONE 80 MG PO CHEW: 80.0000 mg | CHEWABLE_TABLET | ORAL | Status: DC | PRN

## 2016-08-02 MED ORDER — SODIUM CHLORIDE 0.9% FLUSH: 3.0000 mL | INTRAVENOUS | Status: DC | PRN

## 2016-08-02 MED ORDER — PRENATAL MULTIVITAMIN CH
1.0000 | ORAL_TABLET | Freq: Every day | ORAL | Status: DC
  Administered 2016-08-02 – 2016-08-03 (×2): 1 via ORAL
  Filled 2016-08-02 (×2): qty 1

## 2016-08-02 MED ORDER — ONDANSETRON HCL 4 MG/2ML IJ SOLN: 4.0000 mg | INTRAMUSCULAR | Status: DC | PRN

## 2016-08-02 MED ORDER — SODIUM CHLORIDE 0.9 % IV SOLN: 250.0000 mL | INTRAVENOUS | Status: DC | PRN

## 2016-08-02 MED ORDER — COCONUT OIL OIL: 1.0000 "application " | TOPICAL_OIL | Status: DC | PRN

## 2016-08-02 MED ORDER — BENZOCAINE-MENTHOL 20-0.5 % EX AERO
1.0000 "application " | INHALATION_SPRAY | CUTANEOUS | Status: DC | PRN
  Administered 2016-08-02: 1 via TOPICAL
  Filled 2016-08-02: qty 56

## 2016-08-02 MED ORDER — SODIUM CHLORIDE 0.9% FLUSH: 3.0000 mL | Freq: Two times a day (BID) | INTRAVENOUS | Status: DC

## 2016-08-02 MED ORDER — MEASLES, MUMPS & RUBELLA VAC ~~LOC~~ INJ
0.5000 mL | INJECTION | Freq: Once | SUBCUTANEOUS | Status: DC
  Filled 2016-08-02: qty 0.5

## 2016-08-02 MED ORDER — FERROUS SULFATE 325 (65 FE) MG PO TABS
325.0000 mg | ORAL_TABLET | Freq: Two times a day (BID) | ORAL | Status: DC
  Administered 2016-08-02 – 2016-08-04 (×5): 325 mg via ORAL
  Filled 2016-08-02 (×5): qty 1

## 2016-08-02 MED ORDER — ONDANSETRON HCL 4 MG PO TABS: 4.0000 mg | ORAL_TABLET | ORAL | Status: DC | PRN

## 2016-08-02 MED ORDER — OXYCODONE-ACETAMINOPHEN 5-325 MG PO TABS: 1.0000 | ORAL_TABLET | ORAL | Status: DC | PRN

## 2016-08-02 NOTE — Progress Notes (Signed)
Patient is eating, ambulating, voiding.  Pain control is good.  Vitals:   08/02/16 0301 08/02/16 0345 08/02/16 0515 08/02/16 0930  BP: 127/88 124/62 (!) 117/58 (!) 117/53  Pulse: 80 (!) 101 92 93  Resp: 16 18 16 16   Temp:  98.5 F (36.9 C) 98.4 F (36.9 C) 98.3 F (36.8 C)  TempSrc:  Oral Oral Oral  SpO2:      Weight:      Height:        Fundus firm Perineum without swelling.  Lab Results  Component Value Date   WBC 15.7 (H) 08/02/2016   HGB 10.0 (L) 08/02/2016   HCT 28.1 (L) 08/02/2016   MCV 90.6 08/02/2016   PLT 146 (L) 08/02/2016    --/--/B POS, B POS (11/17 0935)/RI  A/P Post partum day 0.  Routine care.  Expect d/c routine.    Cobi Aldape A

## 2016-08-02 NOTE — Anesthesia Postprocedure Evaluation (Signed)
Anesthesia Post Note  Patient: Kelly Parrish  Procedure(s) Performed: * No procedures listed *  Patient location during evaluation: Mother Baby Anesthesia Type: Epidural Level of consciousness: awake and alert, oriented and patient cooperative Pain management: pain level controlled Vital Signs Assessment: post-procedure vital signs reviewed and stable Respiratory status: spontaneous breathing Cardiovascular status: stable Postop Assessment: no headache, epidural receding, patient able to bend at knees and no signs of nausea or vomiting Anesthetic complications: no Comments: Reports pain score of 0.     Last Vitals:  Vitals:   08/02/16 0345 08/02/16 0515  BP: 124/62 (!) 117/58  Pulse: (!) 101 92  Resp: 18 16  Temp: 36.9 C 36.9 C    Last Pain:  Vitals:   08/02/16 0515  TempSrc: Oral  PainSc: 0-No pain   Pain Goal:                 [facility] HospitalWRINKLE,Carlita Whitcomb

## 2016-08-03 NOTE — Lactation Note (Signed)
This note was copied from a baby's chart. Lactation Consultation Note Mom is concerned that baby is not BF well. IBCLC observed feeding.  Suckles are shallow and baby quickly falls asleep.  Snapback is also heard with most suckles.  Suck evaluation with gloved finger reveals weak suction.  Sucking reflex is better when finger is inserted deeply into her mouth. Mom has erect nipples but they are not reaching the hard and soft palate juncture so a #24 NS was applied. Baby suckled deeper but did not stay engaged even with breast compression. Baby was fed 1 ml BM via spoon. Bilirubin is rising so baby is staying another night. Discussed using Alimentum in an SNS to increase intake and support suckling.Parents agreed.  Syringe SNS was set up and was tried over and under the NS. The formula had to be pushed. Baby ate 5 ml.  Plan is to BF with or without the NS. Attempt SNS at breast. Finger feed if not successful. POst-pump to support MS. Follow-up tomorrow. Patient Name: Kelly Clyda GreenerRahel Parrish ZOXWR'UToday's Date: 08/03/2016 Reason for consult: Follow-up assessment   Maternal Data Has patient been taught Hand Expression?: Yes  Feeding Feeding Type: Breast Milk with Formula added Length of feed: 10 min  LATCH Score/Interventions Latch: Repeated attempts needed to sustain latch, nipple held in mouth throughout feeding, stimulation needed to elicit sucking reflex.  Audible Swallowing: None  Type of Nipple: Everted at rest and after stimulation  Comfort (Breast/Nipple): Soft / non-tender     Hold (Positioning): Assistance needed to correctly position infant at breast and maintain latch.  LATCH Score: 6  Lactation Tools Discussed/Used Tools: Nipple Shields Nipple shield size: 24   Consult Status      Kelly DryerJoseph, Kelly Parrish 08/03/2016, 1:13 PM

## 2016-08-03 NOTE — Lactation Note (Signed)
This note was copied from a baby's chart. Lactation Consultation Note  Patient Name: Kelly Parrish Today's Date: 08/03/2016 Reason for consult: Follow-up assessment Baby at 44 hr of life and requesting lactation to help with latch and 5 Fr. Upon entry parents had baby latched without the NS and the tubing appeared to be placed low in the baby's mouth. Lactation repositioned 78Fr so that both holes were in the baby's mouth. It was noted that some of the formula was on baby, blanket, and clothing. Mom was reporting pain with latch. Demonstrated how to pull baby's chin down. Baby will suck 2-3 times then loose suction. Baby is unable to pull the formula from the syringe, even after proper placement. Baby is very sleepy at the breast. Mom does not want to latch baby to the L breast at this time because it is "sore". There is a bright red circle in the middle of the nipple surface. Mom is using her coconut oil from home on her nipples after each feeding. She has a NS but prefers not to use it. She does not think it helps and it is complicated to apply. Mom will continue to offer the breast on demand 8+/24hr with the supplement in volumes per guidelines. She will post pump after every bf. She will offer her expressed milk before offering formula. Parents are aware of lactation services and will call as needed.    Maternal Data    Feeding Feeding Type: Breast Fed Length of feed: 40 min  LATCH Score/Interventions Latch: Repeated attempts needed to sustain latch, nipple held in mouth throughout feeding, stimulation needed to elicit sucking reflex. Intervention(s): Waking techniques Intervention(s): Breast compression;Breast massage  Audible Swallowing: A few with stimulation Intervention(s): Skin to skin;Hand expression Intervention(s): Skin to skin;Hand expression;Alternate breast massage  Type of Nipple: Everted at rest and after stimulation  Comfort (Breast/Nipple): Filling, red/small  blisters or bruises, mild/mod discomfort  Problem noted: Mild/Moderate discomfort;Cracked, bleeding, blisters, bruises Interventions  (Cracked/bleeding/bruising/blister): Expressed breast milk to nipple Interventions (Mild/moderate discomfort):  (coconut oil)  Hold (Positioning): No assistance needed to correctly position infant at breast. Intervention(s): Breastfeeding basics reviewed;Support Pillows;Position options;Skin to skin  LATCH Score: 7  Lactation Tools Discussed/Used Tools: 78F feeding tube / Syringe Nipple shield size: 24   Consult Status Consult Status: Follow-up Date: 08/04/16 Follow-up type: In-patient    Rulon Eisenmengerlizabeth E Karanveer Ramakrishnan 08/03/2016, 10:28 PM

## 2016-08-03 NOTE — Progress Notes (Signed)
Post Partum Day 1 Subjective: no complaints, up ad lib, voiding, tolerating PO, + flatus and breast feeding  Objective: Blood pressure (!) 101/59, pulse 80, temperature 98.2 F (36.8 C), temperature source Oral, resp. rate 18, height 5\' 10"  (1.778 m), weight 109.8 kg (242 lb), last menstrual period 11/01/2015, SpO2 100 %, unknown if currently breastfeeding.  Physical Exam:  General: alert, cooperative and no distress Lochia: appropriate Uterine Fundus: firm perineum: healing well, no significant drainage, no dehiscence, no significant erythema DVT Evaluation: No evidence of DVT seen on physical exam. Negative Homan's sign. No cords or calf tenderness. No significant calf/ankle edema.   Recent Labs  08/01/16 0935 08/02/16 0645  HGB 10.6* 10.0*  HCT 30.6* 28.1*    Assessment/Plan: Plan for discharge tomorrow, Breastfeeding and Lactation consult   LOS: 2 days   Kenidi Elenbaas STACIA 08/03/2016, 1:00 PM

## 2016-08-04 MED ORDER — IBUPROFEN 800 MG PO TABS: 800.0000 mg | ORAL_TABLET | Freq: Three times a day (TID) | ORAL | 1 refills | Status: AC

## 2016-08-04 MED ORDER — OXYCODONE-ACETAMINOPHEN 5-325 MG PO TABS: 1.0000 | ORAL_TABLET | ORAL | 0 refills | Status: DC | PRN

## 2016-08-04 NOTE — Lactation Note (Signed)
This note was copied from a baby's chart. Lactation Consultation Note Mom resting in bed w/rm. Almost dark. Baby sleeping. Asked mom how BF going, mom stated much better. Asked mom if still supplementing, she stated yes. Asked about breast filling and colostrum, mom stated getting more of it. Encouraged mom to feel breast before BF and afterwards to see if baby is getting colostrum from mom. Encouraged to pump and give what she pumps or hand expresses to baby verses formula. If mom has a lot of colostrum, use that instead of formula. [facility] in assessing breast for need of supplementaton of formula. Mom stated she would. Patient Name: Kelly Clyda GreenerRahel Clabo WUXLK'GToday's Date: 08/04/2016 Reason for consult: Follow-up assessment   Maternal Data    Feeding Feeding Type: Breast Milk with Formula added Length of feed: 30 min  LATCH Score/Interventions                      Lactation Tools Discussed/Used     Consult Status Consult Status: Follow-up Date: 08/04/16 Follow-up type: In-patient    Charyl DancerCARVER, Nalda Shackleford G 08/04/2016, 6:23 AM

## 2016-08-04 NOTE — Progress Notes (Signed)
Post Partum Day 2 Subjective: no complaints, up ad lib, voiding, tolerating PO, + flatus and breast feeding  Objective: Blood pressure 114/66, pulse 78, temperature 98.3 F (36.8 C), temperature source Oral, resp. rate 20, height 5\' 10"  (1.778 m), weight 109.8 kg (242 lb), last menstrual period 11/01/2015, SpO2 100 %, unknown if currently breastfeeding.  Physical Exam:  General: alert, cooperative and no distress Lochia: appropriate Uterine Fundus: firm perineum: healing well, no significant drainage, no dehiscence, no significant erythema DVT Evaluation: No evidence of DVT seen on physical exam. Negative Homan's sign. No cords or calf tenderness. No significant calf/ankle edema.   Recent Labs  08/01/16 0935 08/02/16 0645  HGB 10.6* 10.0*  HCT 30.6* 28.1*    Assessment/Plan: Discharge home   LOS: 3 days   Navada Osterhout STACIA 08/04/2016, 9:30 AM

## 2016-08-04 NOTE — Discharge Summary (Signed)
Obstetric Discharge Summary Reason for Admission: induction of labor Prenatal Procedures: none Intrapartum Procedures: spontaneous vaginal delivery Postpartum Procedures: none Complications-Operative and Postpartum: none Hemoglobin  Date Value Ref Range Status  08/02/2016 10.0 (L) 12.0 - 15.0 g/dL Final   HCT  Date Value Ref Range Status  08/02/2016 28.1 (L) 36.0 - 46.0 % Final    Physical Exam:  General: alert, cooperative and no distress Lochia: appropriate Uterine Fundus: firm perineum: healing well, no significant drainage, no dehiscence, no significant erythema DVT Evaluation: No evidence of DVT seen on physical exam. Negative Homan's sign. No cords or calf tenderness. No significant calf/ankle edema.  Discharge Diagnoses: Term Pregnancy-delivered  Discharge Information: Date: 08/04/2016 Activity: pelvic rest Diet: routine Medications: PNV, Ibuprofen, Iron and Percocet Condition: stable Instructions: refer to practice specific booklet Discharge to: home   Newborn Data: Live born female  Birth Weight: 8 lb 1.8 oz (3680 g) APGAR: 8, 9  Home with mother.  Kelly Parrish, Kelly Parrish 08/04/2016, 9:33 AM

## 2016-08-07 ENCOUNTER — Inpatient Hospital Stay (HOSPITAL_COMMUNITY)
Admission: AD | Admit: 2016-08-07 | Payer: BLUE CROSS/BLUE SHIELD | Source: Ambulatory Visit | Admitting: Obstetrics and Gynecology

## 2017-11-18 ENCOUNTER — Emergency Department
Admission: EM | Admit: 2017-11-18 | Discharge: 2017-11-18 | Disposition: A | Discharge status: Home / Self Care | Payer: BLUE CROSS/BLUE SHIELD | Source: Home / Self Care | Attending: Family Medicine | Admitting: Family Medicine

## 2017-11-18 ENCOUNTER — Encounter: Payer: Self-pay | Admitting: Emergency Medicine

## 2017-11-18 ENCOUNTER — Other Ambulatory Visit: Payer: Self-pay

## 2017-11-18 DIAGNOSIS — J020 Streptococcal pharyngitis: Secondary | ICD-10-CM | POA: Diagnosis not present

## 2017-11-18 LAB — POCT CBC W AUTO DIFF (K'VILLE URGENT CARE)

## 2017-11-18 LAB — POCT INFLUENZA A/B
Influenza A, POC: NEGATIVE
Influenza B, POC: NEGATIVE

## 2017-11-18 LAB — POCT RAPID STREP A (OFFICE): Rapid Strep A Screen: POSITIVE — AB

## 2017-11-18 MED ORDER — PENICILLIN V POTASSIUM 500 MG PO TABS: ORAL_TABLET | ORAL | 0 refills | Status: DC

## 2017-11-18 NOTE — ED Triage Notes (Signed)
Pt c/o abdominal pain that is worse on her sides. She gets relief with advil. Yesterday started with intermittent sore throat.

## 2017-11-18 NOTE — ED Provider Notes (Signed)
[facility]    CSN: [id number] Arrival date & time: 11/18/17  0913     History   Chief Complaint Chief Complaint  Patient presents with  . Abdominal Pain    HPI Kelly Parrish is a 37 y.o. female.   Patient complains of onset of sore throat yesterday with myalgias, headache, fatigue, chills, and fever to 100.5.  She has had nausea without vomiting.  She notes that she has had loose stools (2 per day) for several months, and yesterday developed increased abdominal cramps.  No urinary symptoms.   The history is provided by the patient.    Past Medical History:  Diagnosis Date  . Herpes     Patient Active Problem List   Diagnosis Date Noted  . Postpartum state 08/02/2016  . Labor and delivery, indication for care 08/01/2016    Past Surgical History:  Procedure Laterality Date  . HERNIA REPAIR  1981  . WISDOM TOOTH EXTRACTION  1996    OB History    Gravida Para Term Preterm AB Living   3 3 3     3    SAB TAB Ectopic Multiple Live Births         0 2       Home Medications    Prior to Admission medications   Medication Sig Start Date End Date Taking? Authorizing Provider  valACYclovir (VALTREX) 500 MG tablet Take 500 mg by mouth 2 (two) times daily.   Yes [provider]  ferrous fumarate (HEMOCYTE - 106 MG FE) 325 (106 FE) MG TABS Take 1 tablet by mouth.    [provider]  ibuprofen (ADVIL,MOTRIN) 800 MG tablet Take 1 tablet (800 mg total) by mouth 3 (three) times daily. 08/04/16   Essie HartPinn, Walda, MD  oxyCODONE-acetaminophen (PERCOCET/ROXICET) 5-325 MG tablet Take 1-2 tablets by mouth every 4 (four) hours as needed (pain scale > 7). 08/04/16   Essie HartPinn, Walda, MD  penicillin v potassium (VEETID) 500 MG tablet Take one tab by mouth twice daily for 10 days 11/18/17   Lattie HawBeese, Stephen A, MD  Prenatal Vit-Fe Fumarate-FA (PRENATAL MULTIVITAMIN) TABS Take 1 tablet by mouth daily at 12 noon.    [provider]  ranitidine (ZANTAC) 75 MG  tablet Take 75 mg by mouth daily.    [provider]    Family History History reviewed. No pertinent family history.  Social History Social History   Tobacco Use  . Smoking status: Never Smoker  . Smokeless tobacco: Never Used  Substance Use Topics  . Alcohol use: No  . Drug use: No     Allergies   Patient has no known allergies.   Review of Systems Review of Systems + sore throat No cough No pleuritic pain No wheezing No nasal congestion ? post-nasal drainage No sinus pain/pressure No itchy/red eyes No earache No hemoptysis No SOB + fever, + chills + nausea No vomiting + abdominal pain + diarrhea No urinary symptoms No skin rash + fatigue + myalgias + headache Used OTC meds without relief   Physical Exam Triage Vital Signs ED Triage Vitals [11/18/17 0952]  Enc Vitals Group     BP 118/78     Pulse Rate 76     Resp      Temp 98.1 F (36.7 C)     Temp Source Oral     SpO2 100 %     Weight      Height      Head Circumference  Peak Flow      Pain Score 0     Pain Loc      Pain Edu?      Excl. in GC?    No data found.  Updated Vital Signs BP 118/78 (BP Location: Right Arm)   Pulse 76   Temp 98.1 F (36.7 C) (Oral)   SpO2 100%   Visual Acuity Right Eye Distance:   Left Eye Distance:   Bilateral Distance:    Right Eye Near:   Left Eye Near:    Bilateral Near:     Physical Exam Nursing notes and Vital Signs reviewed. Appearance:  Patient appears stated age, and in no acute distress Eyes:  Pupils are equal, round, and reactive to light and accomodation.  Extraocular movement is intact.  Conjunctivae are not inflamed  Ears:  Canals normal.  Tympanic membranes normal.  Nose:  Mildly congested turbinates.  No sinus tenderness.  Pharynx:  Erythematous Neck:  Supple. Tender tonsillar nodes. Lungs:  Clear to auscultation.  Breath sounds are equal.  Moving air well. Heart:  Regular rate and rhythm without murmurs, rubs, or  gallops.  Abdomen:  Vague diffuse mild tenderness without masses or hepatosplenomegaly.  Bowel sounds are present.  No CVA or flank tenderness.  Extremities:  No edema.  Skin:  No rash present.    UC Treatments / Results  Labs (all labs ordered are listed, but only abnormal results are displayed) Labs Reviewed  POCT RAPID STREP A (OFFICE) - Abnormal; Notable for the following components:      Result Value   Rapid Strep A Screen Positive (*)    All other components within normal limits  POCT CBC W AUTO DIFF ([facility]):  WBC 13.4; LY 13.5; MO 7.2; GR 79.3; Hgb 14.3; Platelets 190   POCT INFLUENZA A/B negative    EKG  EKG Interpretation None       Radiology No results found.  Procedures Procedures (including critical care time)  Medications Ordered in UC Medications - No data to display   Initial Impression / Assessment and Plan / UC Course  I have reviewed the triage vital signs and the nursing notes.  Pertinent labs & imaging results that were available during my care of the patient were reviewed by me and considered in my medical decision making (see chart for details).    Begin Pen VK. Try warm salt water gargles for sore throat.  May take Ibuprofen 200mg , 4 tabs every 8 hours with food for sore throat, body aches, headache, etc. Followup with family doctor for evaluation of diarrhea.    Final Clinical Impressions(s) / UC Diagnoses   Final diagnoses:  Strep pharyngitis    ED Discharge Orders        Ordered    penicillin v potassium (VEETID) 500 MG tablet     11/18/17 1039           Lattie Haw, MD 11/22/17 [phone]

## 2017-11-18 NOTE — Discharge Instructions (Signed)
Try warm salt water gargles for sore throat.  May take Ibuprofen 200mg , 4 tabs every 8 hours with food for sore throat, body aches, headache, etc.

## 2018-04-02 ENCOUNTER — Other Ambulatory Visit: Payer: Self-pay

## 2018-04-02 ENCOUNTER — Emergency Department
Admission: EM | Admit: 2018-04-02 | Discharge: 2018-04-02 | Disposition: A | Discharge status: Home / Self Care | Payer: BLUE CROSS/BLUE SHIELD | Source: Home / Self Care | Attending: Family Medicine | Admitting: Family Medicine

## 2018-04-02 ENCOUNTER — Encounter: Payer: Self-pay | Admitting: Emergency Medicine

## 2018-04-02 DIAGNOSIS — R519 Headache, unspecified: Secondary | ICD-10-CM

## 2018-04-02 DIAGNOSIS — R51 Headache: Secondary | ICD-10-CM | POA: Diagnosis not present

## 2018-04-02 NOTE — ED Provider Notes (Signed)
[facility]    CSN: [id number] Arrival date & time: 04/02/18  1705     History   Chief Complaint Chief Complaint  Patient presents with  . Facial Pain    HPI Kelly Parrish is a 37 y.o. female.   Patient complains of 6 to 8 month history of intermittent recurring lancinating pain in her left temporal and facial area.  She thought it was a dental problem and had a left root canal procedure 10 days ago without improvement.  When the pain is present, she has distinct tenderness to palpation over her left temple.  She denies changes in vision or jaw claudication.  No sinus congestion.  No fevers, chills, and sweats.  The history is provided by the patient.    Past Medical History:  Diagnosis Date  . Herpes     Patient Active Problem List   Diagnosis Date Noted  . Postpartum state 08/02/2016  . Labor and delivery, indication for care 08/01/2016    Past Surgical History:  Procedure Laterality Date  . HERNIA REPAIR  1981  . WISDOM TOOTH EXTRACTION  1996    OB History    Gravida  3   Para  3   Term  3   Preterm      AB      Living  3     SAB      TAB      Ectopic      Multiple  0   Live Births  2            Home Medications    Prior to Admission medications   Medication Sig Start Date End Date Taking? Authorizing Provider  ibuprofen (ADVIL,MOTRIN) 800 MG tablet Take 1 tablet (800 mg total) by mouth 3 (three) times daily. 08/04/16   Essie Hart, MD  Prenatal Vit-Fe Fumarate-FA (PRENATAL MULTIVITAMIN) TABS Take 1 tablet by mouth daily at 12 noon.    [provider]    Family History No family history on file.  Social History Social History   Tobacco Use  . Smoking status: Never Smoker  . Smokeless tobacco: Never Used  Substance Use Topics  . Alcohol use: No  . Drug use: No     Allergies   Patient has no known allergies.   Review of Systems Review of Systems  Constitutional: Negative.   HENT: Negative for  dental problem, ear discharge, ear pain, facial swelling, hearing loss, rhinorrhea, sinus pressure, sinus pain, sore throat, tinnitus, trouble swallowing and voice change.   Eyes: Negative.   Respiratory: Negative.   Cardiovascular: Negative.   Gastrointestinal: Negative.   Genitourinary: Negative.   Musculoskeletal: Negative.   Skin: Negative.   Neurological: Positive for headaches. Negative for dizziness and numbness.     Physical Exam Triage Vital Signs ED Triage Vitals  Enc Vitals Group     BP 04/02/18 1733 122/82     Pulse Rate 04/02/18 1733 67     Resp --      Temp 04/02/18 1733 99.1 F (37.3 C)     Temp Source 04/02/18 1733 Oral     SpO2 04/02/18 1733 100 %     Weight 04/02/18 1734 188 lb (85.3 kg)     Height 04/02/18 1734 5\' 10"  (1.778 m)     Head Circumference --      Peak Flow --      Pain Score 04/02/18 1734 9     Pain Loc --  Pain Edu? --      Excl. in GC? --    No data found.  Updated Vital Signs BP 122/82 (BP Location: Right Arm)   Pulse 67   Temp 99.1 F (37.3 C) (Oral)   Ht 5\' 10"  (1.778 m)   Wt 188 lb (85.3 kg)   LMP 03/22/2018 (Exact Date)   SpO2 100%   BMI 26.98 kg/m   Visual Acuity Right Eye Distance:   Left Eye Distance:   Bilateral Distance:    Right Eye Near:   Left Eye Near:    Bilateral Near:     Physical Exam  Constitutional: She appears well-developed and well-nourished. No distress.  HENT:  Head: Normocephalic.    Right Ear: Tympanic membrane, external ear and ear canal normal.  Left Ear: Tympanic membrane, external ear and ear canal normal.  Nose: Nose normal.  Mouth/Throat: Oropharynx is clear and moist.  When patient is symptomatic, she has pain over her left temporal area as noted on diagram.  There is presently no tenderness there, or over her left temporal artery.  No TMJ tenderness to palpation   Eyes: Pupils are equal, round, and reactive to light. Conjunctivae and EOM are normal.  Neck: Neck supple. No  thyromegaly present.  Cardiovascular: Normal rate.  Pulmonary/Chest: Effort normal.  Lymphadenopathy:    She has no cervical adenopathy.  Neurological: She is alert. No cranial nerve deficit.  Skin: Skin is warm and dry. No rash noted.  Nursing note and vitals reviewed.    UC Treatments / Results  Labs (all labs ordered are listed, but only abnormal results are displayed) Labs Reviewed  SEDIMENTATION RATE  C-REACTIVE PROTEIN    EKG None  Radiology No results found.  Procedures Procedures (including critical care time)  Medications Ordered in UC Medications - No data to display  Initial Impression / Assessment and Plan / UC Course  I have reviewed the triage vital signs and the nursing notes.  Pertinent labs & imaging results that were available during my care of the patient were reviewed by me and considered in my medical decision making (see chart for details).    ?trigeminal neuralgia.  ?temporal arteritis Doubt temporal arteritis but will check Sed rate and CRP. Will refer to neurologist for further management.   Final Clinical Impressions(s) / UC Diagnoses   Final diagnoses:  Temporal pain     Discharge Instructions     For now, continue Ibuprofen 200mg , 2 to 4 tabs every 8 hours with food.     ED Prescriptions    None        Lattie HawBeese, Stephen A, MD 04/02/18 1825

## 2018-04-02 NOTE — Discharge Instructions (Addendum)
For now, continue Ibuprofen 200mg , 2 to 4 tabs every 8 hours with food.

## 2018-04-02 NOTE — ED Triage Notes (Signed)
Left trigeminal nerve pain  X 6 months, getting worse. Had a root canal 10 days ago with no relief.

## 2018-04-03 ENCOUNTER — Telehealth: Payer: Self-pay | Admitting: Emergency Medicine

## 2018-04-03 LAB — SEDIMENTATION RATE: Sed Rate: 9 mm/h (ref 0–20)

## 2018-04-03 LAB — C-REACTIVE PROTEIN: CRP: 4.2 mg/L (ref <8.0)

## 2018-04-03 NOTE — Telephone Encounter (Signed)
Spoke with patient and told her that both lab tests were negative; she is to follow with neurologist; she is fine today.

## 2018-04-07 ENCOUNTER — Telehealth: Payer: Self-pay | Admitting: *Deleted

## 2018-04-07 NOTE — Telephone Encounter (Signed)
Patient's husband called regarding a referral to neurology. Per [facility]'s request, I sent over needed information for a referral. Left voicemail with patient informing her info was sent and their office will be in contact with her.

## 2018-04-09 NOTE — Telephone Encounter (Signed)
Patient's husband called again after talking to neurology; they claim they did not receive referral info; called them again today and spoke with Britta MccreedyBarbara; re-sent all requested information; called patient back and told her this had been done.
